# Patient Record
Sex: Male | Born: 1990 | Race: White | Hispanic: No | Marital: Single | Smoking: Current some day smoker
Health system: Southern US, Community
[De-identification: ages and names within clinical notes are randomized; demographics above are authoritative.]

---
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
## 2012-07-06 ENCOUNTER — Emergency Department: Payer: Self-pay | Admitting: Emergency Medicine

## 2020-10-30 ENCOUNTER — Other Ambulatory Visit: Payer: Self-pay

## 2020-10-30 ENCOUNTER — Emergency Department
Admission: EM | Admit: 2020-10-30 | Discharge: 2020-10-30 | Disposition: A | Discharge status: Home / Self Care | Payer: No Typology Code available for payment source | Attending: Emergency Medicine | Admitting: Emergency Medicine

## 2020-10-30 ENCOUNTER — Emergency Department: Payer: No Typology Code available for payment source

## 2020-10-30 DIAGNOSIS — R202 Paresthesia of skin: Secondary | ICD-10-CM | POA: Diagnosis not present

## 2020-10-30 DIAGNOSIS — M25532 Pain in left wrist: Secondary | ICD-10-CM | POA: Diagnosis not present

## 2020-10-30 DIAGNOSIS — Z23 Encounter for immunization: Secondary | ICD-10-CM | POA: Insufficient documentation

## 2020-10-30 DIAGNOSIS — W228XXA Striking against or struck by other objects, initial encounter: Secondary | ICD-10-CM | POA: Insufficient documentation

## 2020-10-30 MED ORDER — TETANUS-DIPHTH-ACELL PERTUSSIS 5-2.5-18.5 LF-MCG/0.5 IM SUSY
0.5000 mL | PREFILLED_SYRINGE | Freq: Once | INTRAMUSCULAR | Status: AC
Start: 2020-10-30 — End: 2020-10-30
  Administered 2020-10-30: 0.5 mL via INTRAMUSCULAR
  Filled 2020-10-30: qty 0.5

## 2020-10-30 NOTE — Discharge Instructions (Addendum)
You take Tylenol 1 g every 8 hours and ibuprofen 6 oh every 6 hours with food to help with pain.  Return the ER with any other concerns but your x-rays were negative

## 2020-10-30 NOTE — ED Provider Notes (Signed)
[facility] Emergency Department Provider Note  ____________________________________________   Event Date/Time   First MD Initiated Contact with Patient 10/30/20 1923     (approximate)  I have reviewed the triage vital signs and the nursing notes.   HISTORY  Chief Complaint Arm Injury    HPI Blake Cabrera is a 30 y.o. male who is otherwise healthy who came in for injury.  Patient was working with a machinery when the handle came loose and swung around really fast and hit his left wrist.  Patient reports swelling noted to the left wrist.  Patient does have some pain there that is constant, worse with movement, better at rest.  He states that he developed a little bit of tingling on his third fourth and fifth finger but he states it was after he had put ice on it.  Denies getting hurt anywhere else.  No pain up into his forearm or in his elbow.      History reviewed. No pertinent past medical history.  There are no problems to display for this patient.   History reviewed. No pertinent surgical history.  Prior to Admission medications   Not on File    Allergies Patient has no allergy information on record.  No family history on file.  Social History No daily drinking/drugs    Review of Systems Constitutional: No fever/chills Eyes: No visual changes. ENT: No sore throat. Cardiovascular: Denies chest pain. Respiratory: Denies shortness of breath. Gastrointestinal: No abdominal pain.  No nausea, no vomiting.  No diarrhea.  No constipation. Genitourinary: Negative for dysuria. Musculoskeletal: Negative for back pain. Left hand pain  Skin: Negative for rash. Neurological: Negative for headaches, focal weakness or numbness. All other ROS negative ____________________________________________   PHYSICAL EXAM:  VITAL SIGNS: ED Triage Vitals  Enc Vitals Group     BP 10/30/20 1807 (!) 149/89     Pulse Rate 10/30/20 1807 93     Resp  10/30/20 1807 18     Temp 10/30/20 1807 98 F (36.7 C)     Temp src --      SpO2 10/30/20 1807 97 %     Weight --      Height --      Head Circumference --      Peak Flow --      Pain Score 10/30/20 1743 10     Pain Loc --      Pain Edu? --      Excl. in GC? --     Constitutional: Alert and oriented. Well appearing and in no acute distress. Eyes: Conjunctivae are normal. EOMI. Head: Atraumatic. Nose: No congestion/rhinnorhea. Mouth/Throat: Mucous membranes are moist.   Neck: No stridor. Trachea Midline. FROM Cardiovascular: Normal rate, regular rhythm. Grossly normal heart sounds.  Good peripheral circulation. Respiratory: Normal respiratory effort.  No retractions. Lungs CTAB. Gastrointestinal: Soft and nontender. No distention. No abdominal bruits.  Musculoskeletal: Very small abrasion noted to the left wrist along his pinky finger.  Some mild swelling there.  Able to move all of his fingers.  Nerves appear intact.  2+ distal pulse.  No snuffbox tenderness Neurologic:  Normal speech and language. No gross focal neurologic deficits are appreciated.  Skin:  Skin is warm, dry and intact. No rash noted. Psychiatric: Mood and affect are normal. Speech and behavior are normal. GU: Deferred   _ RADIOLOGY I, Concha Se, personally viewed and evaluated these images (plain radiographs) as part of my medical decision making, as  well as reviewing the written report by the radiologist.  ED MD interpretation: No fracture  Official radiology report(s): DG Wrist Complete Left  Result Date: 10/30/2020 CLINICAL DATA:  Wrist pain after injury today. EXAM: LEFT HAND - COMPLETE 3+ VIEW; LEFT WRIST - COMPLETE 3+ VIEW COMPARISON:  None. FINDINGS: Three views of the left hand and four views of the left wrist are obtained. No evidence of acute fracture or dislocation in the left hand or wrist. Benign-appearing sclerosis in the scaphoid likely representing a benign bone island. Otherwise, no focal  bone lesions are identified. Joint spaces are normal. No bone erosions. Soft tissues are unremarkable. IMPRESSION: No acute fracture or dislocation in the left hand or wrist. Electronically Signed   By: Burman Nieves M.D.   On: 10/30/2020 20:12   DG Hand Complete Left  Result Date: 10/30/2020 CLINICAL DATA:  Wrist pain after injury today. EXAM: LEFT HAND - COMPLETE 3+ VIEW; LEFT WRIST - COMPLETE 3+ VIEW COMPARISON:  None. FINDINGS: Three views of the left hand and four views of the left wrist are obtained. No evidence of acute fracture or dislocation in the left hand or wrist. Benign-appearing sclerosis in the scaphoid likely representing a benign bone island. Otherwise, no focal bone lesions are identified. Joint spaces are normal. No bone erosions. Soft tissues are unremarkable. IMPRESSION: No acute fracture or dislocation in the left hand or wrist. Electronically Signed   By: Burman Nieves M.D.   On: 10/30/2020 20:12    ____________________________________________   PROCEDURES  Procedure(s) performed (including Critical Care):  Procedures   ____________________________________________   INITIAL IMPRESSION / ASSESSMENT AND PLAN / ED COURSE  [name] was evaluated in Emergency Department on 10/30/2020 for the symptoms described in the history of present illness. He was evaluated in the context of the global COVID-19 pandemic, which necessitated consideration that the patient might be at risk for infection with the SARS-CoV-2 virus that causes COVID-19. Institutional protocols and algorithms that pertain to the evaluation of patients at risk for COVID-19 are in a state of rapid change based on information released by regulatory bodies including the CDC and federal and state organizations. These policies and algorithms were followed during the patient's care in the ED.    Patient comes in with either fracture, dislocation versus contusion.  Will get x-rays to further evaluate patient  is neurovascularly intact.  Reported some tingling with icing.  X-rays are negative for fracture.  On repeat evaluation patient denies any tingling.  Neuro vastly intact.  Discussed with patient patient is right-handed.  Patient feels comfortable with discharge home updated his tetanus.       ____________________________________________   FINAL CLINICAL IMPRESSION(S) / ED DIAGNOSES   Final diagnoses:  Left wrist pain      MEDICATIONS GIVEN DURING THIS VISIT:  Medications  Tdap (BOOSTRIX) injection 0.5 mL (has no administration in time range)     ED Discharge Orders    None       Note:  This document was prepared using Dragon voice recognition software and may include unintentional dictation errors.   Concha Se, MD 10/30/20 2104

## 2020-10-30 NOTE — ED Triage Notes (Signed)
Pt comes with c/o arm pain while working at home today. Pt states left hand pain. Pt states swelling as well.

## 2021-06-24 ENCOUNTER — Ambulatory Visit
Admission: EM | Admit: 2021-06-24 | Discharge: 2021-06-24 | Disposition: A | Discharge status: Home / Self Care | Payer: No Typology Code available for payment source | Attending: Emergency Medicine | Admitting: Emergency Medicine

## 2021-06-24 ENCOUNTER — Other Ambulatory Visit: Payer: Self-pay

## 2021-06-24 DIAGNOSIS — H66001 Acute suppurative otitis media without spontaneous rupture of ear drum, right ear: Secondary | ICD-10-CM

## 2021-06-24 DIAGNOSIS — J069 Acute upper respiratory infection, unspecified: Secondary | ICD-10-CM

## 2021-06-24 MED ORDER — AMOXICILLIN-POT CLAVULANATE 875-125 MG PO TABS
1.0000 | ORAL_TABLET | Freq: Two times a day (BID) | ORAL | 0 refills | Status: AC
Start: 2021-06-24 — End: 2021-07-04

## 2021-06-24 MED ORDER — IPRATROPIUM BROMIDE 0.06 % NA SOLN: 2.0000 | Freq: Four times a day (QID) | NASAL | 12 refills | Status: AC

## 2021-06-24 NOTE — Discharge Instructions (Signed)
Take the Augmentin twice daily for 10 days with food for treatment of your ear infection.  Take an over-the-counter probiotic 1 hour after each dose of antibiotic to prevent diarrhea.  Use over-the-counter Tylenol and ibuprofen as needed for pain or fever.  Place a hot water bottle, or heating pad, underneath your pillowcase at night to help dilate up your ear and aid in pain relief as well as resolution of the infection.  Use the Atrovent nasal spray, 2 squirts in each nostril every 6 hours, as needed for nasal congestion.  Return for reevaluation for any new or worsening symptoms.  

## 2021-06-24 NOTE — ED Triage Notes (Signed)
Patient is here today with Family for "Ear pain" (Right). Started with "Congestion for a while now". Then ear pain started "at a solid 7". Ringing in ear also. Fever "slight" recently. URI symptoms have been about "5 days".

## 2021-06-24 NOTE — ED Provider Notes (Signed)
[facility]    CSN: [id number] Arrival date & time: 06/24/21  0951      History   Chief Complaint Chief Complaint  Patient presents with   Otalgia    HPI Blake Cabrera is a 30 y.o. male.   HPI  30 year old male here for evaluation of right ear pain.  Patient reports that he had nasal congestion and runny nose that developed 5 days ago and is still present that was followed by intense pain in the right ear, decreased hearing, and ringing in the right ear.  He is also intervally complaining of headache, chills, fever, and a productive cough for a yellow mucus.  He also states that he is having thick yellow discharge from both nares.  He denies any sore throat or shortness of breath.  No GI complaints.  History reviewed. No pertinent past medical history.  There are no problems to display for this patient.   History reviewed. No pertinent surgical history.     Home Medications    Prior to Admission medications   Medication Sig Start Date End Date Taking? Authorizing Provider  amoxicillin-clavulanate (AUGMENTIN) 875-125 MG tablet Take 1 tablet by mouth every 12 (twelve) hours for 10 days. 06/24/21 07/04/21 Yes Becky Augusta, NP  ipratropium (ATROVENT) 0.06 % nasal spray Place 2 sprays into both nostrils 4 (four) times daily. 06/24/21  Yes Becky Augusta, NP    Family History No family history on file.  Social History Social History   Tobacco Use   Smoking status: Some Days    Packs/day: 0.50    Types: Cigarettes   Smokeless tobacco: Never  Vaping Use   Vaping Use: Never used  Substance Use Topics   Alcohol use: Yes    Comment: Daily   Drug use: Yes    Comment: THC occassionally.     Allergies   Patient has no known allergies.   Review of Systems Review of Systems  Constitutional:  Positive for chills and fever. Negative for activity change and appetite change.  HENT:  Positive for congestion, ear pain, hearing loss, rhinorrhea and tinnitus.  Negative for sore throat.   Respiratory:  Positive for cough. Negative for shortness of breath and wheezing.   Gastrointestinal:  Negative for diarrhea, nausea and vomiting.  Skin:  Negative for rash.  Neurological:  Positive for headaches.  Hematological: Negative.   Psychiatric/Behavioral: Negative.      Physical Exam Triage Vital Signs ED Triage Vitals  Enc Vitals Group     BP 06/24/21 1055 119/74     Pulse Rate 06/24/21 1055 88     Resp 06/24/21 1055 18     Temp 06/24/21 1055 99 F (37.2 C)     Temp Source 06/24/21 1055 Oral     SpO2 06/24/21 1055 100 %     Weight 06/24/21 1053 186 lb (84.4 kg)     Height --      Head Circumference --      Peak Flow --      Pain Score 06/24/21 1053 0     Pain Loc --      Pain Edu? --      Excl. in GC? --    No data found.  Updated Vital Signs BP 119/74 (BP Location: Right Arm)    Pulse 88    Temp 99 F (37.2 C) (Oral)    Resp 18    Wt 186 lb (84.4 kg)    SpO2 100%   Visual Acuity Right  Eye Distance:   Left Eye Distance:   Bilateral Distance:    Right Eye Near:   Left Eye Near:    Bilateral Near:     Physical Exam Vitals and nursing note reviewed.  Constitutional:      General: He is not in acute distress.    Appearance: Normal appearance. He is normal weight. He is not ill-appearing.  HENT:     Head: Normocephalic and atraumatic.     Right Ear: Ear canal and external ear normal. There is no impacted cerumen.     Left Ear: Tympanic membrane, ear canal and external ear normal. There is no impacted cerumen.     Nose: Congestion and rhinorrhea present.     Mouth/Throat:     Mouth: Mucous membranes are moist.     Pharynx: Oropharynx is clear. Posterior oropharyngeal erythema present.  Cardiovascular:     Rate and Rhythm: Normal rate and regular rhythm.     Pulses: Normal pulses.     Heart sounds: Normal heart sounds. No murmur heard.   No gallop.  Pulmonary:     Effort: Pulmonary effort is normal.     Breath sounds:  Normal breath sounds. No wheezing, rhonchi or rales.  Musculoskeletal:     Cervical back: Normal range of motion and neck supple.  Lymphadenopathy:     Cervical: No cervical adenopathy.  Skin:    General: Skin is warm and dry.     Capillary Refill: Capillary refill takes less than 2 seconds.     Findings: No erythema or rash.  Neurological:     General: No focal deficit present.     Mental Status: He is alert and oriented to person, place, and time.  Psychiatric:        Mood and Affect: Mood normal.        Behavior: Behavior normal.        Thought Content: Thought content normal.        Judgment: Judgment normal.     UC Treatments / Results  Labs (all labs ordered are listed, but only abnormal results are displayed) Labs Reviewed - No data to display  EKG   Radiology No results found.  Procedures Procedures (including critical care time)  Medications Ordered in UC Medications - No data to display  Initial Impression / Assessment and Plan / UC Course  I have reviewed the triage vital signs and the nursing notes.  Pertinent labs & imaging results that were available during my care of the patient were reviewed by me and considered in my medical decision making (see chart for details).  Patient is a nontoxic-appearing 30 year old male here for evaluation of upper respiratory symptoms and right ear pain as outlined in the HPI above.  All symptoms have been present for last 3 to 5 days.  On physical exam patient has an erythematous and injected right tympanic membrane with frank pus visible in the middle ear.  Left tympanic membrane is pearly gray with a normal light reflex.  Both external auditory canals are clear.  Nasal mucosa is erythematous and edematous with thick yellow discharge in both nares.  Oropharyngeal exam reveals posterior oropharyngeal erythema with yellow postnasal drip.  No cervical lymphadenopathy pressure on exam.  Cardiopulmonary exam reveals clear lung  sounds in all fields.  Patient's exam is consistent with an upper respiratory infection and otitis media.  We will treat the otitis media with Augmentin twice daily for 10 days.  I have also given the patient  Atrovent nasal spray to help with his nasal congestion.  He has not had any coughing while in the exam room and states that his cough is intermittent.  I have advised him to use over-the-counter cough preparations such as Delsym, Zarbee's, or Robitussin.   Final Clinical Impressions(s) / UC Diagnoses   Final diagnoses:  Non-recurrent acute suppurative otitis media of right ear without spontaneous rupture of tympanic membrane  Upper respiratory tract infection, unspecified type     Discharge Instructions      Take the Augmentin twice daily for 10 days with food for treatment of your ear infection.  Take an over-the-counter probiotic 1 hour after each dose of antibiotic to prevent diarrhea.  Use over-the-counter Tylenol and ibuprofen as needed for pain or fever.  Place a hot water bottle, or heating pad, underneath your pillowcase at night to help dilate up your ear and aid in pain relief as well as resolution of the infection.  Use the Atrovent nasal spray, 2 squirts in each nostril every 6 hours, as needed for nasal congestion.  Return for reevaluation for any new or worsening symptoms.      ED Prescriptions     Medication Sig Dispense Auth. Provider   amoxicillin-clavulanate (AUGMENTIN) 875-125 MG tablet Take 1 tablet by mouth every 12 (twelve) hours for 10 days. 20 tablet Becky Augusta, NP   ipratropium (ATROVENT) 0.06 % nasal spray Place 2 sprays into both nostrils 4 (four) times daily. 15 mL Becky Augusta, NP      PDMP not reviewed this encounter.   Becky Augusta, NP 06/24/21 1217

## 2022-01-24 IMAGING — DX DG HAND COMPLETE 3+V*L*
3 series · 3 of 3 positions shown · non-contrast
Comparison: None.

CLINICAL DATA: Wrist pain after injury today.

EXAM:
LEFT HAND - COMPLETE 3+ VIEW; LEFT WRIST - COMPLETE 3+ VIEW

[hand ap]
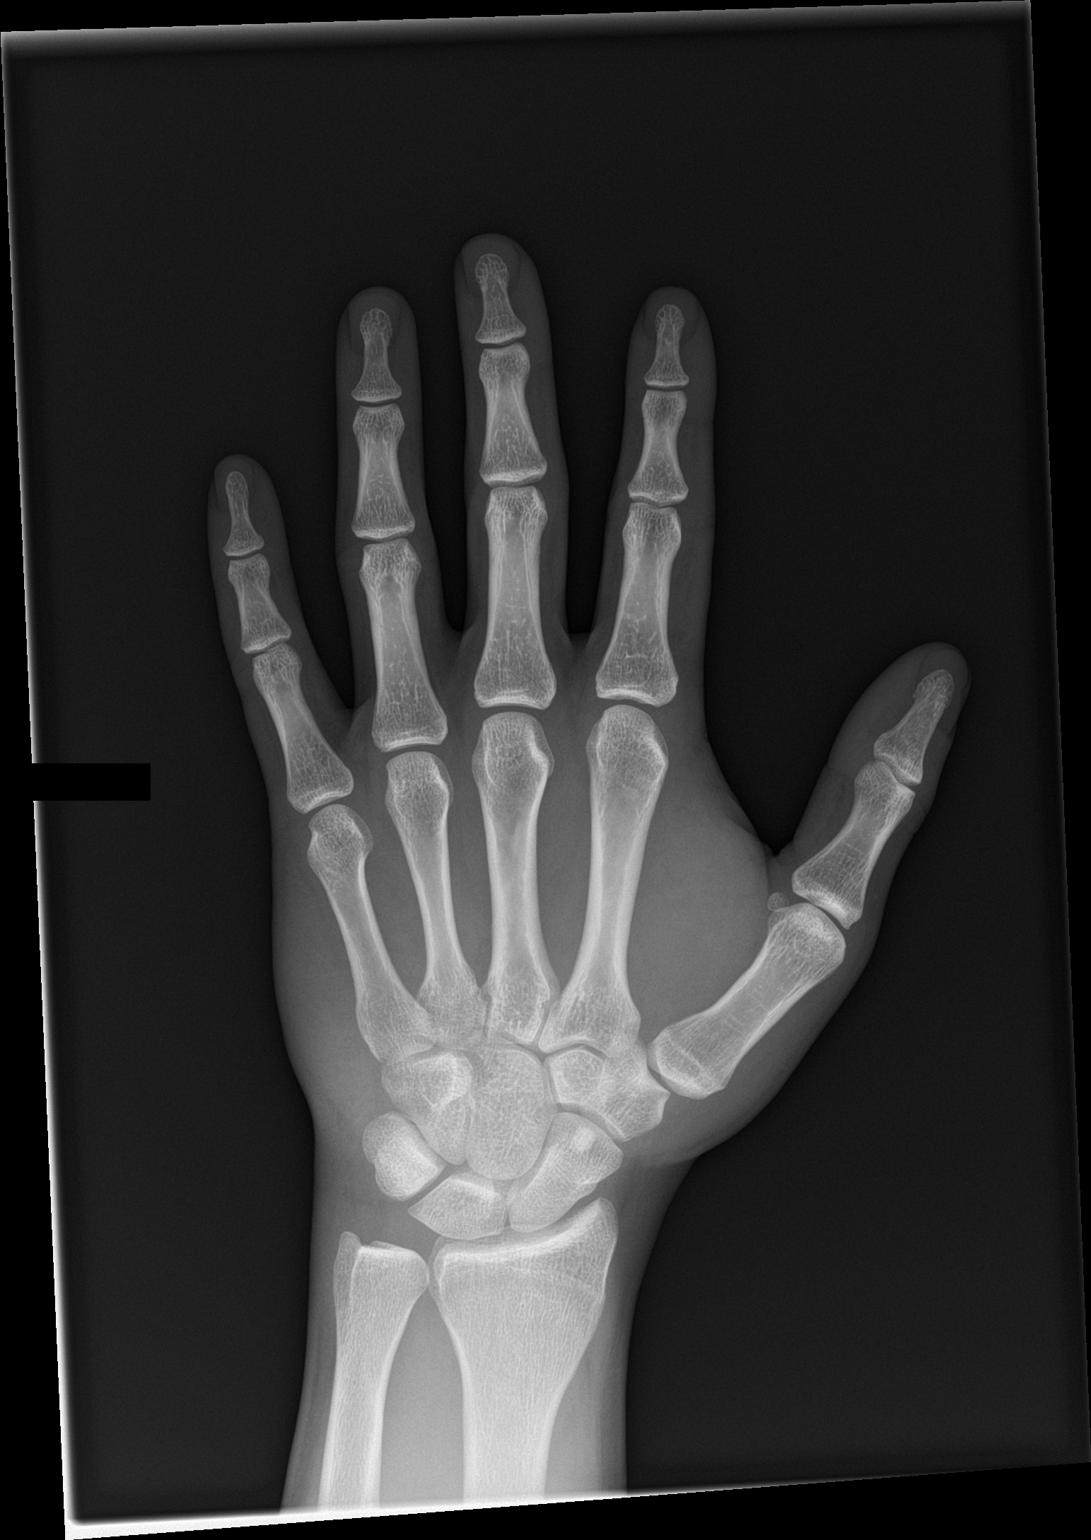

[hand obl]
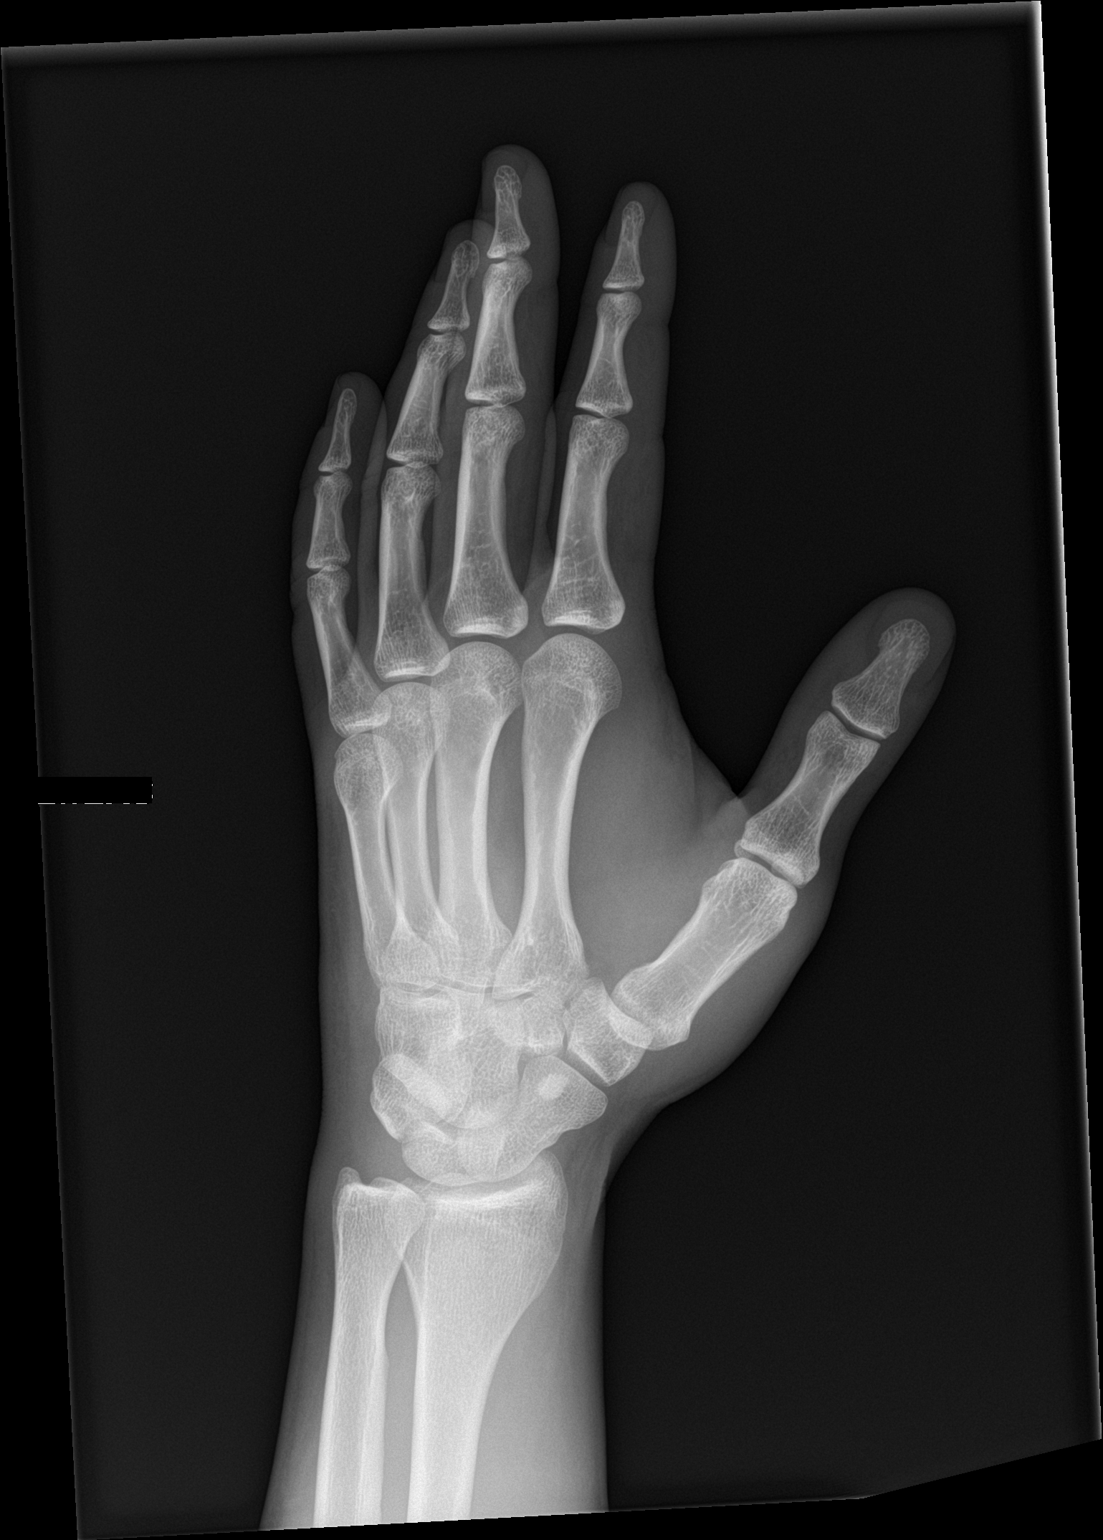

[hand lat]
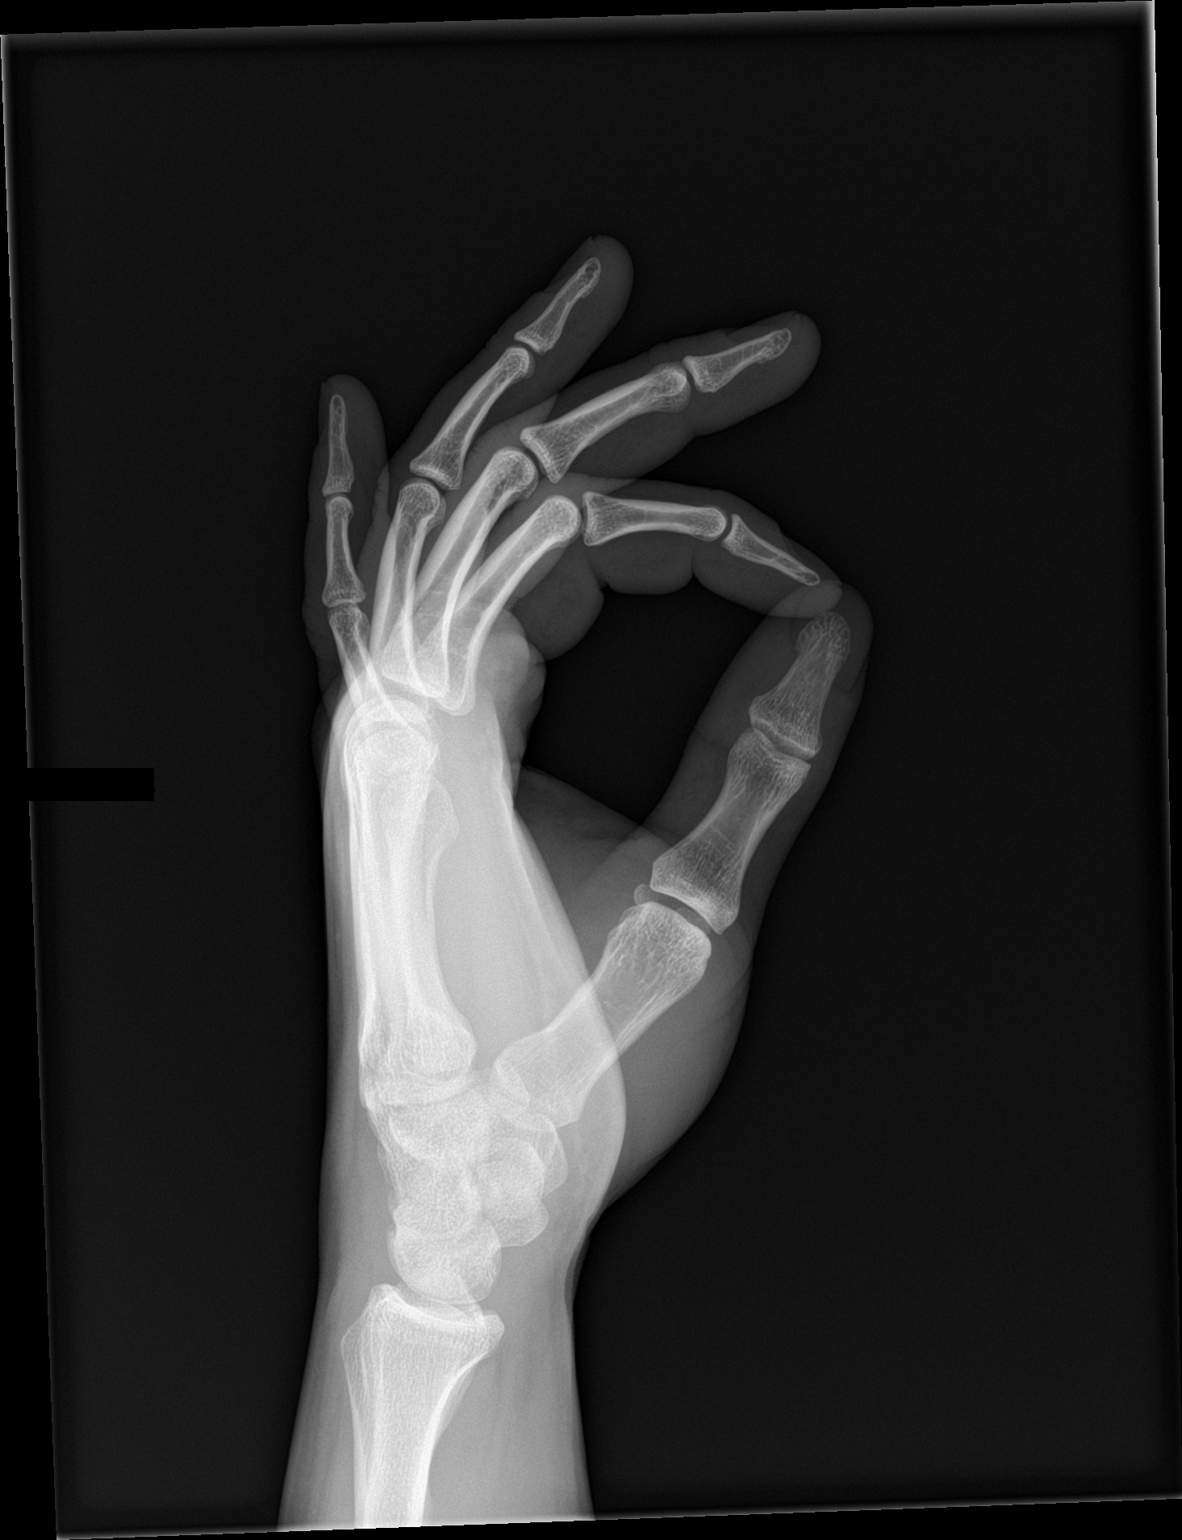

[3 of 3 positions shown; findings below may reference images not displayed]

FINDINGS: Three views of the left hand and four views of the left wrist are
obtained.

No evidence of acute fracture or dislocation in the left hand or
wrist. Benign-appearing sclerosis in the scaphoid likely
representing a benign bone island. Otherwise, no focal bone lesions
are identified. Joint spaces are normal. No bone erosions. Soft
tissues are unremarkable.
IMPRESSION: No acute fracture or dislocation in the left hand or wrist.

## 2022-04-22 ENCOUNTER — Ambulatory Visit
Admission: EM | Admit: 2022-04-22 | Discharge: 2022-04-22 | Disposition: A | Discharge status: Home / Self Care | Payer: No Typology Code available for payment source

## 2022-04-22 ENCOUNTER — Encounter: Payer: Self-pay | Admitting: Emergency Medicine

## 2022-04-22 DIAGNOSIS — S61216A Laceration without foreign body of right little finger without damage to nail, initial encounter: Secondary | ICD-10-CM

## 2022-04-22 NOTE — Discharge Instructions (Signed)
Laceration is superficial and does appear deep enough to cause a tendon injury, will not need sutures for healing   Cleanse daily during normal hygiene using diluted soapy water and cover with a nonadherent Band-Aid  Take ibuprofen 800 mg every 8 hours for 5 days, this is to help reduce the swelling which I believe is aiding to your inability to bend the finger, this will also help with any pain  Place ice or heat over the affected area in 10 to 15-minute intervals to further help reduce swelling  In 3 days if you are still unable to bend or playing GERD please go be evaluated by orthopedics, information is on front page

## 2022-04-22 NOTE — ED Triage Notes (Signed)
Patient states that he cut his right 5th finger on a steak knife around 6pm last night.  Patient has laceration to his right 5th finger.  Patient states that he cannot bend his finger.

## 2022-04-22 NOTE — ED Provider Notes (Signed)
[facility]    CSN: [id number] Arrival date & time: 04/22/22  0919      History   Chief Complaint Chief Complaint  Patient presents with   Extremity Laceration    Right 5th finger    HPI Parmveer Urbanek is a 31 y.o. male.   Patient presents with laceration to the right fifth finger occurring at approximately 6 PM 1 day ago.  Endorses that he was cutting food when the a steak knife sliced the finger.  Initially cleansed and covered with nonadherent dressing.  Endorses inability to bend finger but able to fully straighten.  Denies numbness or tingling.  Has not attempted treatment further.  History reviewed. No pertinent past medical history.  There are no problems to display for this patient.   History reviewed. No pertinent surgical history.     Home Medications    Prior to Admission medications   Medication Sig Start Date End Date Taking? Authorizing Provider  ipratropium (ATROVENT) 0.06 % nasal spray Place 2 sprays into both nostrils 4 (four) times daily. 06/24/21   Becky Augusta, NP    Family History History reviewed. No pertinent family history.  Social History Social History   Tobacco Use   Smoking status: Some Days    Packs/day: 0.50    Types: Cigarettes   Smokeless tobacco: Current    Types: Chew  Vaping Use   Vaping Use: Never used  Substance Use Topics   Alcohol use: Yes    Comment: Daily   Drug use: Yes    Types: Marijuana    Comment: THC occassionally.     Allergies   Patient has no known allergies.   Review of Systems Review of Systems  Constitutional: Negative.   Respiratory: Negative.    Cardiovascular: Negative.   Skin:  Positive for wound. Negative for color change, pallor and rash.     Physical Exam Triage Vital Signs ED Triage Vitals  Enc Vitals Group     BP 04/22/22 0937 (!) 135/90     Pulse Rate 04/22/22 0937 74     Resp 04/22/22 0937 15     Temp 04/22/22 0937 98.6 F (37 C)     Temp Source 04/22/22 0937  Oral     SpO2 04/22/22 0937 99 %     Weight 04/22/22 0935 180 lb (81.6 kg)     Height 04/22/22 0935 5\' 9"  (1.753 m)     Head Circumference --      Peak Flow --      Pain Score 04/22/22 0934 4     Pain Loc --      Pain Edu? --      Excl. in GC? --    No data found.  Updated Vital Signs BP (!) 135/90 (BP Location: Left Arm)   Pulse 74   Temp 98.6 F (37 C) (Oral)   Resp 15   Ht 5\' 9"  (1.753 m)   Wt 180 lb (81.6 kg)   SpO2 99%   BMI 26.58 kg/m   Visual Acuity Right Eye Distance:   Left Eye Distance:   Bilateral Distance:    Right Eye Near:   Left Eye Near:    Bilateral Near:     Physical Exam Constitutional:      Appearance: Normal appearance.  HENT:     Head: Normocephalic.  Eyes:     Extraocular Movements: Extraocular movements intact.  Pulmonary:     Effort: Pulmonary effort is normal.  Skin:  Comments: 1 cm superficial laceration  the proximal phalanx joint of the right fifth finger on the palmar aspect, mild to moderate swelling present over the joint without tenderness, ecchymosis or deformity, unable to fully flex but able to extend, capillary refill less than 3, sensation intact  Neurological:     Mental Status: He is alert and oriented to person, place, and time. Mental status is at baseline.  Psychiatric:        Mood and Affect: Mood normal.        Behavior: Behavior normal.      UC Treatments / Results  Labs (all labs ordered are listed, but only abnormal results are displayed) Labs Reviewed - No data to display  EKG   Radiology No results found.  Procedures Procedures (including critical care time)  Medications Ordered in UC Medications - No data to display  Initial Impression / Assessment and Plan / UC Course  I have reviewed the triage vital signs and the nursing notes.  Pertinent labs & imaging results that were available during my care of the patient were reviewed by me and considered in my medical decision making (see chart for  details).  Laceration of right little finger without foreign body without damage to nail, initial encounter  We will defer suturing today due to to laceration being superficial, etiology of limited range of motion is most likely due to swelling, low suspicion for tendon involvement as there is no depth to the laceration, discussed this with patient, advised consistent use of NSAIDs, ice, heat and elevation for supportive measures, advised to cleanse daily during normal hygiene with diluted soapy water and cover if at risk for contamination, given precautions to return for signs of infection and given strict precautions that if no improvement seen in movement in 72 hours that he is to follow-up with orthopedics Final Clinical Impressions(s) / UC Diagnoses   Final diagnoses:  Laceration of right little finger without foreign body without damage to nail, initial encounter     Discharge Instructions      Laceration is superficial and does appear deep enough to cause a tendon injury, will not need sutures for healing   Cleanse daily during normal hygiene using diluted soapy water and cover with a nonadherent Band-Aid  Take ibuprofen 800 mg every 8 hours for 5 days, this is to help reduce the swelling which I believe is aiding to your inability to bend the finger, this will also help with any pain  Place ice or heat over the affected area in 10 to 15-minute intervals to further help reduce swelling  In 3 days if you are still unable to bend or playing GERD please go be evaluated by orthopedics, information is on front page   ED Prescriptions   None    PDMP not reviewed this encounter.   Hans Eden, [place] 04/22/22 [phone]

## 2023-08-19 ENCOUNTER — Other Ambulatory Visit: Payer: Self-pay | Admitting: Family Medicine

## 2023-08-19 DIAGNOSIS — R52 Pain, unspecified: Secondary | ICD-10-CM
